# Patient Record
Sex: Female | Born: 1965 | Race: White | Hispanic: No | Marital: Single | Smoking: Never smoker
Health system: Southern US, Community
[De-identification: ages and names within clinical notes are randomized; demographics above are authoritative.]

---

## 2001-06-20 ENCOUNTER — Other Ambulatory Visit: Admission: RE | Admit: 2001-06-20 | Discharge: 2001-06-20 | Payer: Self-pay | Admitting: *Deleted

## 2001-06-20 ENCOUNTER — Other Ambulatory Visit: Admission: RE | Admit: 2001-06-20 | Discharge: 2001-06-20 | Payer: Self-pay | Admitting: Obstetrics & Gynecology

## 2001-07-03 ENCOUNTER — Encounter: Payer: Self-pay | Admitting: *Deleted

## 2001-07-03 ENCOUNTER — Inpatient Hospital Stay (HOSPITAL_COMMUNITY): Admission: AD | Admit: 2001-07-03 | Discharge: 2001-07-03 | Payer: Self-pay | Admitting: Obstetrics & Gynecology

## 2002-12-30 ENCOUNTER — Ambulatory Visit (HOSPITAL_COMMUNITY): Admission: RE | Admit: 2002-12-30 | Discharge: 2002-12-30 | Payer: Self-pay | Admitting: Internal Medicine

## 2003-04-23 ENCOUNTER — Ambulatory Visit (HOSPITAL_COMMUNITY): Admission: RE | Admit: 2003-04-23 | Discharge: 2003-04-23 | Payer: Self-pay | Admitting: Thoracic Surgery

## 2003-05-09 ENCOUNTER — Ambulatory Visit (HOSPITAL_COMMUNITY): Admission: RE | Admit: 2003-05-09 | Discharge: 2003-05-09 | Payer: Self-pay | Admitting: Thoracic Surgery

## 2006-08-03 ENCOUNTER — Ambulatory Visit (HOSPITAL_COMMUNITY): Admission: RE | Admit: 2006-08-03 | Discharge: 2006-08-03 | Payer: Self-pay | Admitting: Internal Medicine

## 2016-05-18 ENCOUNTER — Other Ambulatory Visit: Payer: Self-pay | Admitting: Obstetrics & Gynecology

## 2016-05-18 DIAGNOSIS — R928 Other abnormal and inconclusive findings on diagnostic imaging of breast: Secondary | ICD-10-CM

## 2016-06-14 ENCOUNTER — Ambulatory Visit
Admission: RE | Admit: 2016-06-14 | Discharge: 2016-06-14 | Disposition: A | Discharge status: Home / Self Care | Payer: BLUE CROSS/BLUE SHIELD | Source: Ambulatory Visit | Attending: Obstetrics & Gynecology | Admitting: Obstetrics & Gynecology

## 2016-06-14 DIAGNOSIS — R928 Other abnormal and inconclusive findings on diagnostic imaging of breast: Secondary | ICD-10-CM

## 2017-01-09 ENCOUNTER — Encounter (HOSPITAL_COMMUNITY): Payer: Self-pay | Admitting: Emergency Medicine

## 2017-01-09 ENCOUNTER — Ambulatory Visit (HOSPITAL_COMMUNITY)
Admission: EM | Admit: 2017-01-09 | Discharge: 2017-01-09 | Disposition: A | Discharge status: Home / Self Care | Payer: BLUE CROSS/BLUE SHIELD | Attending: Family Medicine | Admitting: Family Medicine

## 2017-01-09 DIAGNOSIS — R42 Dizziness and giddiness: Secondary | ICD-10-CM

## 2017-01-09 MED ORDER — MECLIZINE HCL 25 MG PO TABS: ORAL_TABLET | ORAL | 0 refills | Status: AC

## 2017-01-09 NOTE — ED Triage Notes (Signed)
Pt here for intermittent dizziness onset 4 days associated w/nausea  Denies HAs, fevers, cold sx  A&O x4... NAD.... ambulatory

## 2017-01-09 NOTE — ED Provider Notes (Signed)
[facility]    CSN: [id number] Arrival date & time: 01/09/17  1013     History   Chief Complaint Chief Complaint  Patient presents with  . Dizziness    HPI Katherine Norris is a 51 y.o. female.   51 year old female complaining of dizziness for 2 days. Occasionally associated with transient mild nausea. She also has some right ear discomfort, PND and cough. She states that lying on one side or the other sometimes exacerbate symptoms as discussed leaning over or turning her head rapidly.      History reviewed. No pertinent past medical history.  There are no active problems to display for this patient.   History reviewed. No pertinent surgical history.  OB History    No data available       Home Medications    Prior to Admission medications   Medication Sig Start Date End Date Taking? Authorizing Provider  meclizine (ANTIVERT) 25 MG tablet Take 1/2 to 1 tab BID or q hs prn dizziness. 01/09/17   Hayden Rasmussen, NP    Family History History reviewed. No pertinent family history.  Social History Social History  Substance Use Topics  . Smoking status: Never Smoker  . Smokeless tobacco: Never Used  . Alcohol use No     Allergies   Patient has no known allergies.   Review of Systems Review of Systems  Constitutional: Negative.   HENT: Negative for congestion, sinus pain, sore throat and trouble swallowing.   Respiratory: Negative.   Cardiovascular: Negative.   Musculoskeletal: Negative.   Neurological: Positive for dizziness. Negative for tremors, seizures, syncope, facial asymmetry, speech difficulty, weakness, numbness and headaches.  Psychiatric/Behavioral: Negative.   All other systems reviewed and are negative.    Physical Exam Triage Vital Signs ED Triage Vitals [01/09/17 1102]  Enc Vitals Group     BP 119/61     Pulse Rate 61     Resp 16     Temp 98.1 F (36.7 C)     Temp Source Oral     SpO2 100 %     Weight      Height    Head Circumference      Peak Flow      Pain Score      Pain Loc      Pain Edu?      Excl. in GC?    No data found.   Updated Vital Signs BP 119/61 (BP Location: Left Arm)   Pulse 61   Temp 98.1 F (36.7 C) (Oral)   Resp 16   LMP  (Approximate)   SpO2 100%   Visual Acuity Right Eye Distance:   Left Eye Distance:   Bilateral Distance:    Right Eye Near:   Left Eye Near:    Bilateral Near:     Physical Exam  Constitutional: She is oriented to person, place, and time. She appears well-developed and well-nourished. No distress.  HENT:  Head: Normocephalic and atraumatic.  Right Ear: External ear normal.  Mouth/Throat: No oropharyngeal exudate.  Left TM little dull otherwise normal. Right TM moderately retracted. No effusion or erythema.  Oropharynx with moderate erythema and injection. Mild-to-moderate amount of clear PND.  Eyes: EOM are normal.  Neck: Normal range of motion. Neck supple.  Cardiovascular: Normal rate, regular rhythm, normal heart sounds and intact distal pulses.   Pulmonary/Chest: Effort normal and breath sounds normal. No respiratory distress. She has no wheezes.  Musculoskeletal: Normal range of motion. She  exhibits no deformity.  Neurological: She is alert and oriented to person, place, and time.  Skin: Skin is warm and dry.  Psychiatric: She has a normal mood and affect.  Nursing note and vitals reviewed.    UC Treatments / Results  Labs (all labs ordered are listed, but only abnormal results are displayed) Labs Reviewed - No data to display  EKG  EKG Interpretation None       Radiology No results found.  Procedures Procedures (including critical care time)  Medications Ordered in UC Medications - No data to display   Initial Impression / Assessment and Plan / UC Course  I have reviewed the triage vital signs and the nursing notes.  Pertinent labs & imaging results that were available during my care of the patient were reviewed  by me and considered in my medical decision making (see chart for details).    Drink plenty of fluids and stay well-hydrated. Recommend taking Allegra or Zyrtec or Claritin to help with the drainage. He may take the Antivert at nighttime to help with dizziness. Drink plenty fluids and stay well-hydrated. If getting worse or not getting better in the next few days follow-up with her primary care doctor.    Final Clinical Impressions(s) / UC Diagnoses   Final diagnoses:  Dizziness    New Prescriptions New Prescriptions   MECLIZINE (ANTIVERT) 25 MG TABLET    Take 1/2 to 1 tab BID or q hs prn dizziness.     Controlled Substance Prescriptions [place] Controlled Substance Registry consulted? Not Applicable   Hayden Rasmussen, NP 01/09/17 1140

## 2017-01-09 NOTE — Discharge Instructions (Signed)
Drink plenty of fluids and stay well-hydrated. Recommend taking Allegra or Zyrtec or Claritin to help with the drainage. He may take the Antivert at nighttime to help with dizziness. Drink plenty fluids and stay well-hydrated. If getting worse or not getting better in the next few days follow-up with her primary care doctor.

## 2017-02-27 ENCOUNTER — Other Ambulatory Visit: Payer: Self-pay | Admitting: Obstetrics & Gynecology

## 2017-02-27 DIAGNOSIS — N6320 Unspecified lump in the left breast, unspecified quadrant: Secondary | ICD-10-CM

## 2017-03-02 ENCOUNTER — Other Ambulatory Visit: Payer: Self-pay | Admitting: Obstetrics & Gynecology

## 2017-03-02 ENCOUNTER — Ambulatory Visit
Admission: RE | Admit: 2017-03-02 | Discharge: 2017-03-02 | Disposition: A | Discharge status: Home / Self Care | Payer: BLUE CROSS/BLUE SHIELD | Source: Ambulatory Visit | Attending: Obstetrics & Gynecology | Admitting: Obstetrics & Gynecology

## 2017-03-02 DIAGNOSIS — N6320 Unspecified lump in the left breast, unspecified quadrant: Secondary | ICD-10-CM

## 2017-06-01 ENCOUNTER — Ambulatory Visit
Admission: RE | Admit: 2017-06-01 | Discharge: 2017-06-01 | Disposition: A | Discharge status: Home / Self Care | Payer: BLUE CROSS/BLUE SHIELD | Source: Ambulatory Visit | Attending: Obstetrics & Gynecology | Admitting: Obstetrics & Gynecology

## 2017-06-01 DIAGNOSIS — N6320 Unspecified lump in the left breast, unspecified quadrant: Secondary | ICD-10-CM

## 2019-12-31 ENCOUNTER — Other Ambulatory Visit: Payer: Self-pay | Admitting: Physician Assistant

## 2019-12-31 DIAGNOSIS — N63 Unspecified lump in unspecified breast: Secondary | ICD-10-CM

## 2020-01-14 ENCOUNTER — Other Ambulatory Visit: Payer: Self-pay | Admitting: Internal Medicine

## 2020-01-15 ENCOUNTER — Other Ambulatory Visit: Payer: Self-pay | Admitting: Obstetrics & Gynecology

## 2020-01-15 ENCOUNTER — Other Ambulatory Visit: Payer: Self-pay | Admitting: Physician Assistant

## 2020-01-15 DIAGNOSIS — N63 Unspecified lump in unspecified breast: Secondary | ICD-10-CM

## 2020-01-16 ENCOUNTER — Ambulatory Visit
Admission: RE | Admit: 2020-01-16 | Discharge: 2020-01-16 | Disposition: A | Discharge status: Home / Self Care | Payer: BLUE CROSS/BLUE SHIELD | Source: Ambulatory Visit | Attending: Physician Assistant | Admitting: Physician Assistant

## 2020-01-16 ENCOUNTER — Other Ambulatory Visit: Payer: Self-pay

## 2020-01-16 ENCOUNTER — Other Ambulatory Visit: Payer: Self-pay | Admitting: Obstetrics & Gynecology

## 2020-01-16 ENCOUNTER — Ambulatory Visit
Admission: RE | Admit: 2020-01-16 | Discharge: 2020-01-16 | Disposition: A | Discharge status: Home / Self Care | Payer: BC Managed Care – PPO | Source: Ambulatory Visit | Attending: Physician Assistant | Admitting: Physician Assistant

## 2020-01-16 DIAGNOSIS — N63 Unspecified lump in unspecified breast: Secondary | ICD-10-CM

## 2020-01-17 ENCOUNTER — Other Ambulatory Visit: Payer: Self-pay | Admitting: Physician Assistant

## 2020-06-24 ENCOUNTER — Emergency Department (HOSPITAL_BASED_OUTPATIENT_CLINIC_OR_DEPARTMENT_OTHER)
Admission: EM | Admit: 2020-06-24 | Discharge: 2020-06-24 | Disposition: A | Discharge status: Home / Self Care | Payer: BC Managed Care – PPO | Attending: Emergency Medicine | Admitting: Emergency Medicine

## 2020-06-24 ENCOUNTER — Emergency Department (HOSPITAL_BASED_OUTPATIENT_CLINIC_OR_DEPARTMENT_OTHER): Payer: BC Managed Care – PPO

## 2020-06-24 ENCOUNTER — Encounter (HOSPITAL_BASED_OUTPATIENT_CLINIC_OR_DEPARTMENT_OTHER): Payer: Self-pay | Admitting: Emergency Medicine

## 2020-06-24 DIAGNOSIS — R079 Chest pain, unspecified: Secondary | ICD-10-CM | POA: Diagnosis not present

## 2020-06-24 DIAGNOSIS — R0789 Other chest pain: Secondary | ICD-10-CM

## 2020-06-24 LAB — BASIC METABOLIC PANEL
Anion gap: 10 (ref 5–15)
BUN: 12 mg/dL (ref 6–20)
CO2: 29 mmol/L (ref 22–32)
Calcium: 9.5 mg/dL (ref 8.9–10.3)
Chloride: 100 mmol/L (ref 98–111)
Creatinine, Ser: 0.9 mg/dL (ref 0.44–1.00)
GFR, Estimated: >60 mL/min (ref >60)
Glucose, Bld: 119 mg/dL — ABNORMAL HIGH (ref 70–99)
Potassium: 3.8 mmol/L (ref 3.5–5.1)
Sodium: 139 mmol/L (ref 135–145)

## 2020-06-24 LAB — CBC WITH DIFFERENTIAL/PLATELET
Abs Immature Granulocytes: 0.02 10*3/uL (ref 0.00–0.07)
Basophils Absolute: 0 10*3/uL (ref 0.0–0.1)
Basophils Relative: 1 %
Eosinophils Absolute: 0.1 10*3/uL (ref 0.0–0.5)
Eosinophils Relative: 2 %
HCT: 38.4 % (ref 36.0–46.0)
Hemoglobin: 12.9 g/dL (ref 12.0–15.0)
Immature Granulocytes: 0 %
Lymphocytes Relative: 35 %
Lymphs Abs: 2.2 10*3/uL (ref 0.7–4.0)
MCH: 28.5 pg (ref 26.0–34.0)
MCHC: 33.6 g/dL (ref 30.0–36.0)
MCV: 84.8 fL (ref 80.0–100.0)
Monocytes Absolute: 0.6 10*3/uL (ref 0.1–1.0)
Monocytes Relative: 9 %
Neutro Abs: 3.3 10*3/uL (ref 1.7–7.7)
Neutrophils Relative %: 53 %
Platelets: 278 10*3/uL (ref 150–400)
RBC: 4.53 MIL/uL (ref 3.87–5.11)
RDW: 12.9 % (ref 11.5–15.5)
WBC: 6.2 10*3/uL (ref 4.0–10.5)
nRBC: 0 % (ref 0.0–0.2)

## 2020-06-24 LAB — TROPONIN I (HIGH SENSITIVITY)
Troponin I (High Sensitivity): 3 ng/L (ref <18)
Troponin I (High Sensitivity): 3 ng/L (ref <18)

## 2020-06-24 NOTE — Discharge Instructions (Addendum)
Call your primary care doctor or specialist as discussed in the next 2-3 days.   Return immediately back to the ER if:  Your symptoms worsen within the next 12-24 hours. You develop new symptoms such as new fevers, persistent vomiting, new pain, shortness of breath, or new weakness or numbness, or if you have any other concerns.  

## 2020-06-24 NOTE — ED Provider Notes (Signed)
[facility] EMERGENCY DEPARTMENT Provider Note   CSN: [id number] Arrival date & time: 06/24/20  1507     History Chief Complaint  Patient presents with  . Chest Pain    Katherine Norris is a 55 y.o. female.  Patient presents with concern for mid chest pain.  Describes as a pressure-like sensation that is intermittent throughout the day.  Symptoms have been off and on for the past 2 weeks.  States that she had an episode of exertion a few days ago where she walked around her building and went up and down several flights of stairs breaking the small sweat, but had no chest pain or chest discomfort with exertion.  Currently denies any pain or discomfort she called her primary care doctor who advised her to come to the ER for further evaluation.  No fevers no cough no vomiting no diarrhea no shortness of breath.        History reviewed. No pertinent past medical history.  There are no problems to display for this patient.   History reviewed. No pertinent surgical history.   OB History   No obstetric history on file.     Family History  Problem Relation Age of Onset  . Breast cancer Maternal Aunt   . Breast cancer Maternal Grandmother     Social History   Tobacco Use  . Smoking status: Never Smoker  . Smokeless tobacco: Never Used  Substance Use Topics  . Alcohol use: No  . Drug use: Yes    Types: Marijuana    Home Medications Prior to Admission medications   Medication Sig Start Date End Date Taking? Authorizing Provider  meclizine (ANTIVERT) 25 MG tablet Take 1/2 to 1 tab BID or q hs prn dizziness. 01/09/17   Hayden Rasmussen, NP    Allergies    Patient has no known allergies.  Review of Systems   Review of Systems  Constitutional: Negative for fever.  HENT: Negative for ear pain.   Eyes: Negative for pain.  Respiratory: Negative for cough.   Cardiovascular: Positive for chest pain.  Gastrointestinal: Negative for abdominal pain.  Genitourinary:  Negative for flank pain.  Musculoskeletal: Negative for back pain.  Skin: Negative for rash.  Neurological: Negative for headaches.    Physical Exam Updated Vital Signs BP 116/68   Pulse 61   Temp 98 F (36.7 C) (Oral)   Resp (!) 21   Ht 5' 5.5" (1.664 m)   Wt 101.3 kg   SpO2 99%   BMI 36.61 kg/m   Physical Exam Constitutional:      General: She is not in acute distress.    Appearance: Normal appearance.  HENT:     Head: Normocephalic.     Nose: Nose normal.  Eyes:     Extraocular Movements: Extraocular movements intact.  Cardiovascular:     Rate and Rhythm: Normal rate.  Pulmonary:     Effort: Pulmonary effort is normal.  Musculoskeletal:        General: Normal range of motion.     Cervical back: Normal range of motion.  Neurological:     General: No focal deficit present.     Mental Status: She is alert. Mental status is at baseline.     ED Results / Procedures / Treatments   Labs (all labs ordered are listed, but only abnormal results are displayed) Labs Reviewed  BASIC METABOLIC PANEL - Abnormal; Notable for the following components:      Result Value  Glucose, Bld 119 (*)    All other components within normal limits  CBC WITH DIFFERENTIAL/PLATELET  TROPONIN I (HIGH SENSITIVITY)  TROPONIN I (HIGH SENSITIVITY)    EKG EKG Interpretation  Date/Time:  Wednesday June 24 2020 15:21:22 EDT Ventricular Rate:  69 PR Interval:    QRS Duration: 93 QT Interval:  407 QTC Calculation: 436 R Axis:   79 Text Interpretation: Sinus rhythm Confirmed by Norman Clay ([id number]) on 06/24/2020 3:47:43 PM   Radiology DG Chest Port 1 View  Result Date: 06/24/2020 CLINICAL DATA:  Central chest pain EXAM: PORTABLE CHEST 1 VIEW COMPARISON:  08/03/2006 FINDINGS: The heart size and mediastinal contours are within normal limits. Both lungs are clear. The visualized skeletal structures are unremarkable. IMPRESSION: No active disease. Electronically Signed   By: Alcide Clever  M.D.   On: 06/24/2020 16:07    Procedures Procedures   Medications Ordered in ED Medications - No data to display  ED Course  I have reviewed the triage vital signs and the nursing notes.  Pertinent labs & imaging results that were available during my care of the patient were reviewed by me and considered in my medical decision making (see chart for details).    MDM Rules/Calculators/A&P                          EKGs normal-appearing sinus rhythm no ST elevations, no ST depressions, normal rate.  2 sets of troponin sent and both are unremarkable.  Will advise outpatient follow-up with cardiology within the week.  Advised immediate return for worsening symptoms trouble breathing or any additional concerns.   Final Clinical Impression(s) / ED Diagnoses Final diagnoses:  Chest pain    Rx / DC Orders ED Discharge Orders    None       Cheryll Cockayne, MD 06/24/20 [phone]

## 2020-06-24 NOTE — ED Triage Notes (Signed)
Reports central chest pain describes as sore for the last few weeks.  Reports she recently gained back the 60 pounds she had lost so her diet has been very poor.

## 2022-01-17 IMAGING — MG DIGITAL DIAGNOSTIC BILAT W/ TOMO W/ CAD
8 series · 8 of 24 positions shown · non-contrast
Comparison: Previous exam(s).

CLINICAL DATA: 54-year-old female presenting for follow-up of a
probably benign left breast mass first evaluated in June 2016.

EXAM:
DIGITAL DIAGNOSTIC BILATERAL MAMMOGRAM WITH TOMO
ULTRASOUND LEFT BREAST

[R MLO synth-2D]
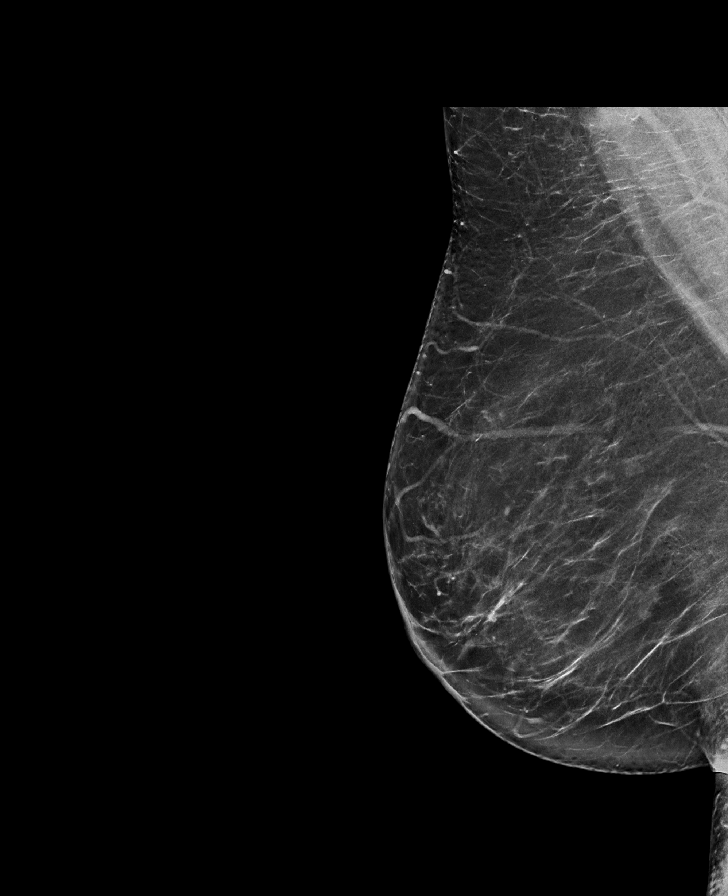

[L MLO synth-2D]
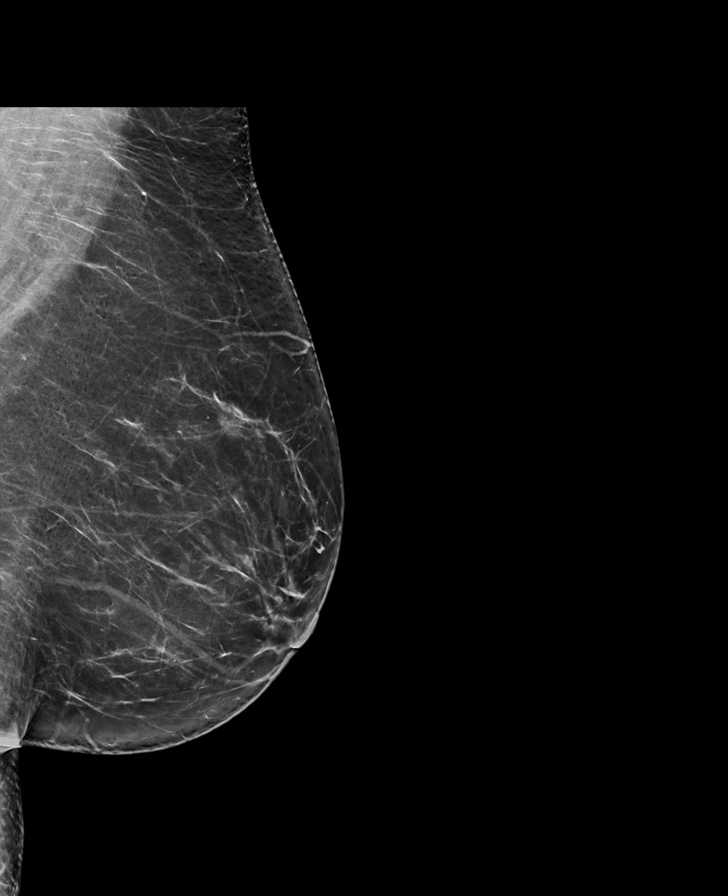

[R CC synth-2D]
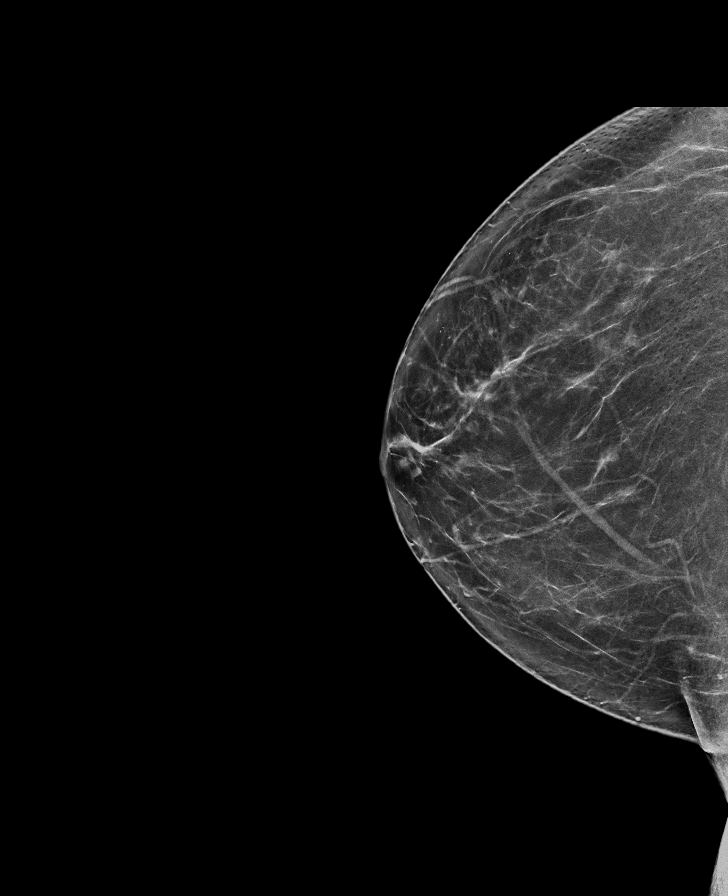

[L CC synth-2D]
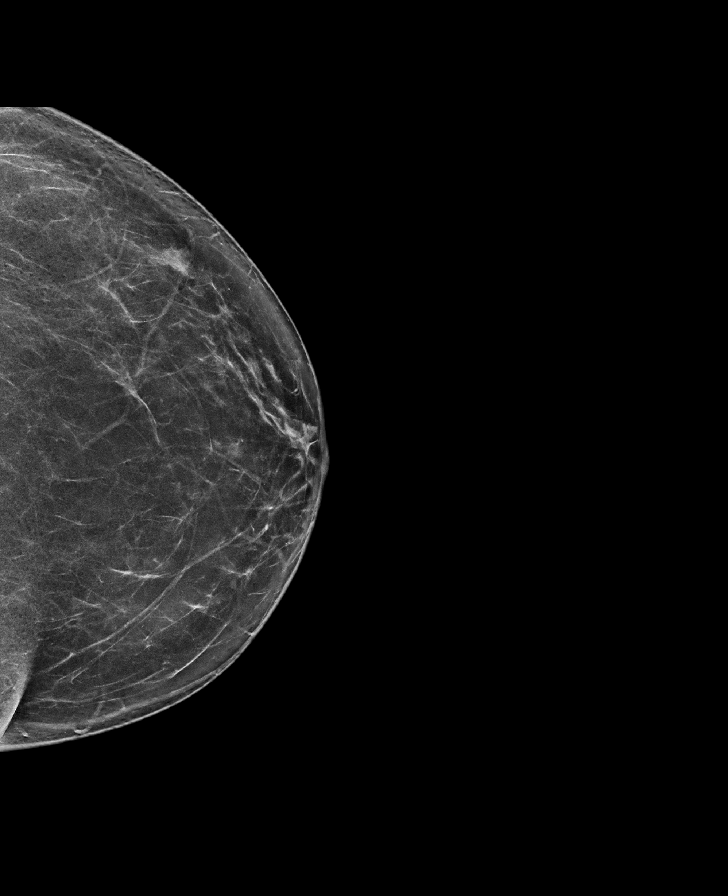

[L MLO tomo · tomo slice 41/81.0]
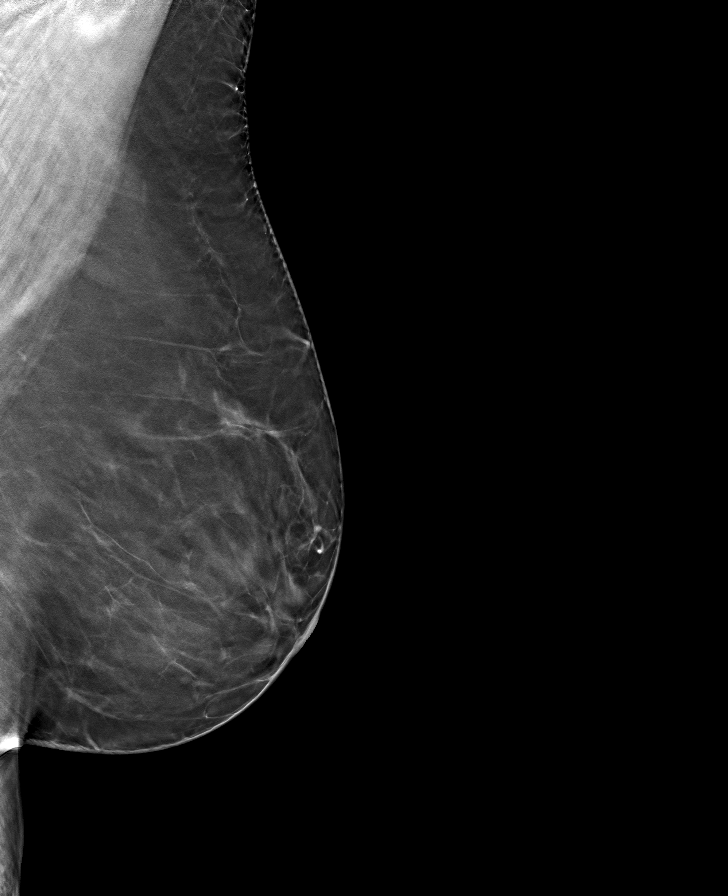

[R CC tomo · tomo slice 39/77.0]
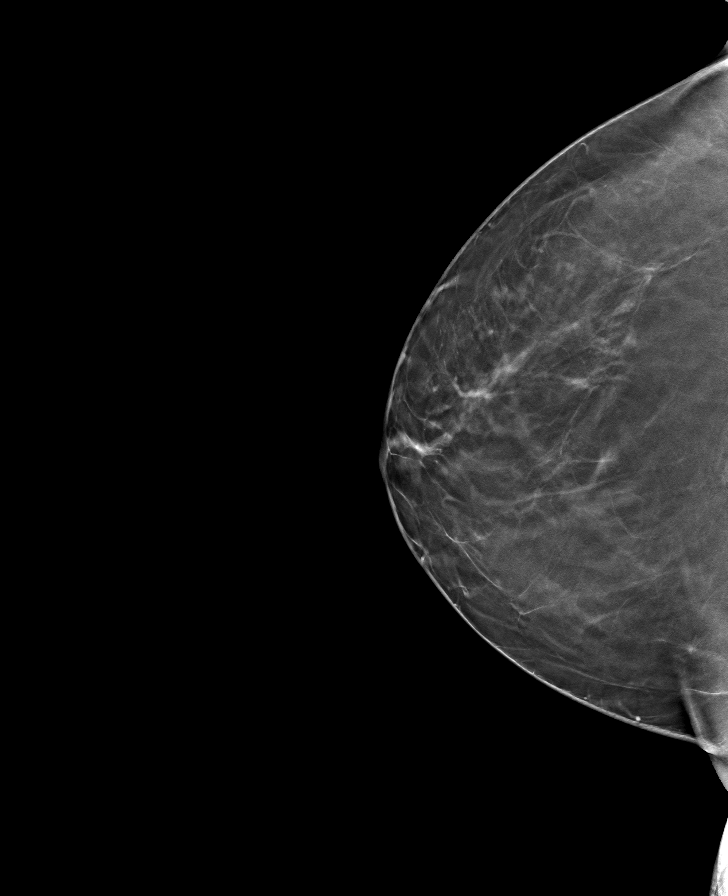

[R MLO tomo · tomo slice 42/83.0]
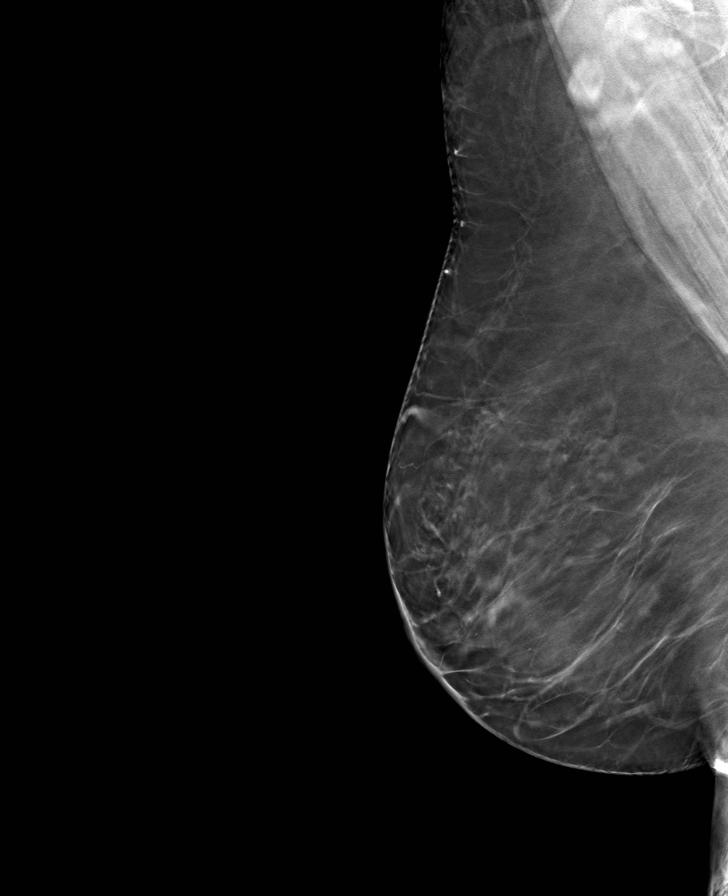

[L CC tomo · tomo slice 38/75.0]
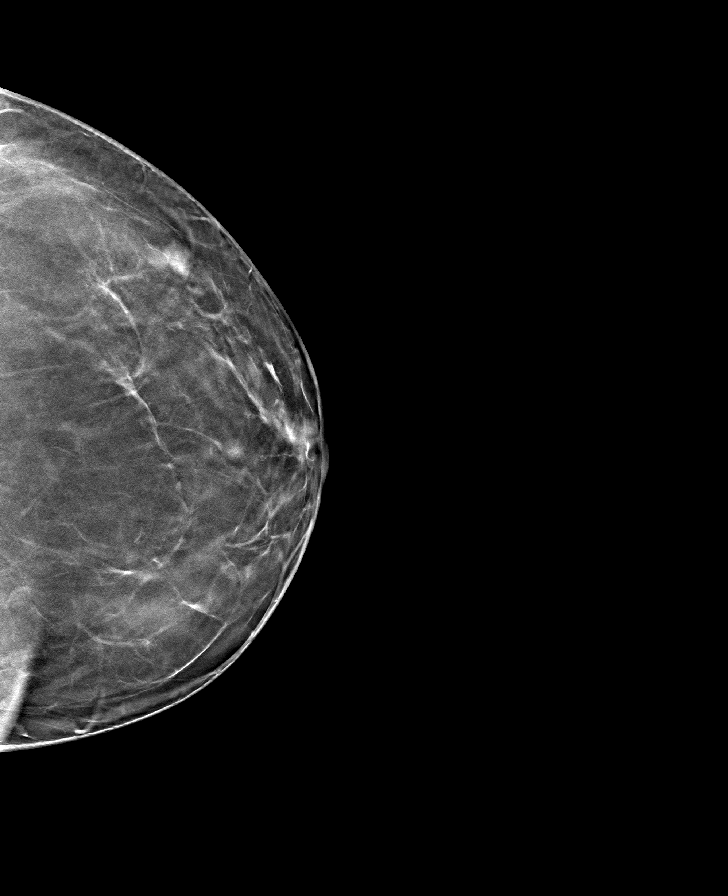

[8 of 24 positions shown; findings below may reference images not displayed]

ACR Breast Density Category b: There are scattered areas of
fibroglandular density.
FINDINGS: Mammogram:

Right breast: No suspicious mass, distortion, or microcalcifications
are identified to suggest presence of malignancy.

Left breast: Stable oval circumscribed mass in the upper outer
quadrant the left breast measuring approximately 0.8 cm. No new
suspicious mass, distortion, or microcalcifications are identified
to suggest presence of malignancy.

Ultrasound:

Targeted ultrasound is performed in the left breast at 2 o'clock 4
cm from nipple demonstrating an oval circumscribed hypoechoic mass
with probable central fatty hilum measuring 0.8 x 0.3 x 0.9 cm,
previously measuring 0.8 x 0.3 x 0.9 cm.
IMPRESSION: Stable mass in the left breast at 2 o'clock, likely a lymph node.
Given stability for over 2 years this is considered benign. No
mammographic evidence of malignancy in the bilateral breasts.

RECOMMENDATION:
Screening mammogram in one year.(Code:BT-6-N91)

I have discussed the findings and recommendations with the patient.
If applicable, a reminder letter will be sent to the patient
regarding the next appointment.

BI-RADS CATEGORY  2: Benign.

## 2022-01-17 IMAGING — US US BREAST*L* LIMITED INC AXILLA
1 series · 6 of 6 positions shown · non-contrast
Comparison: Previous exam(s).

CLINICAL DATA: 54-year-old female presenting for follow-up of a
probably benign left breast mass first evaluated in June 2016.

EXAM:
DIGITAL DIAGNOSTIC BILATERAL MAMMOGRAM WITH TOMO
ULTRASOUND LEFT BREAST

[Series 1: us breast*left* limited inc axilla · 0.06mm/px · 6 of 6 slices shown]
[im 1/6]
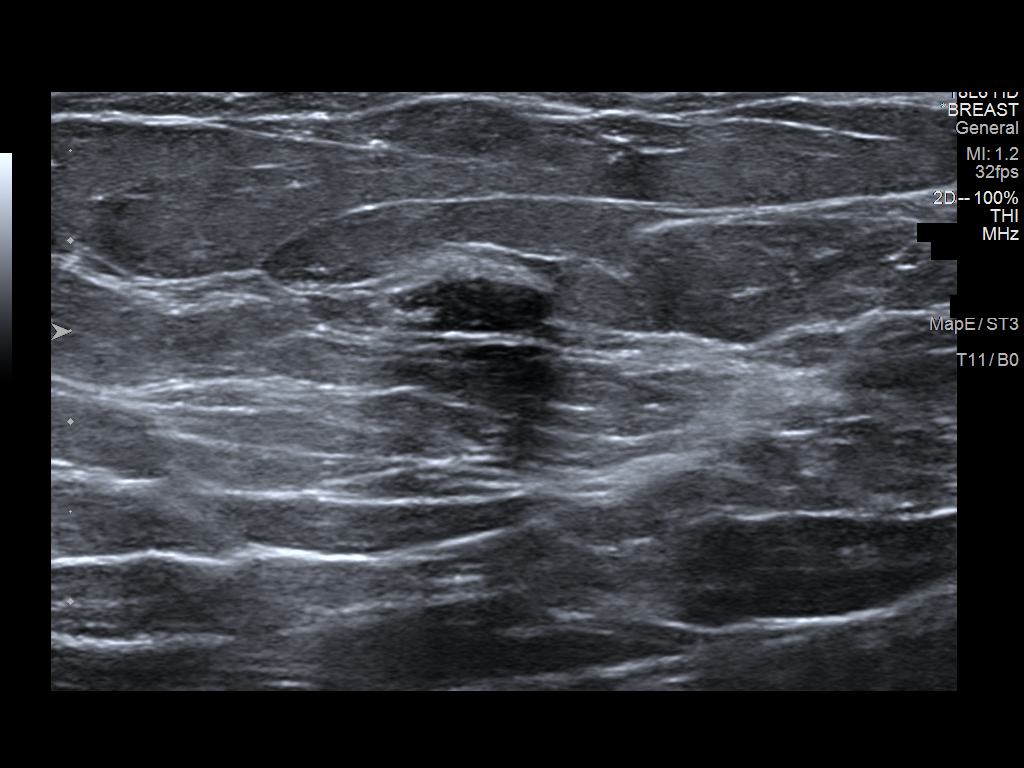
[im 2/6]
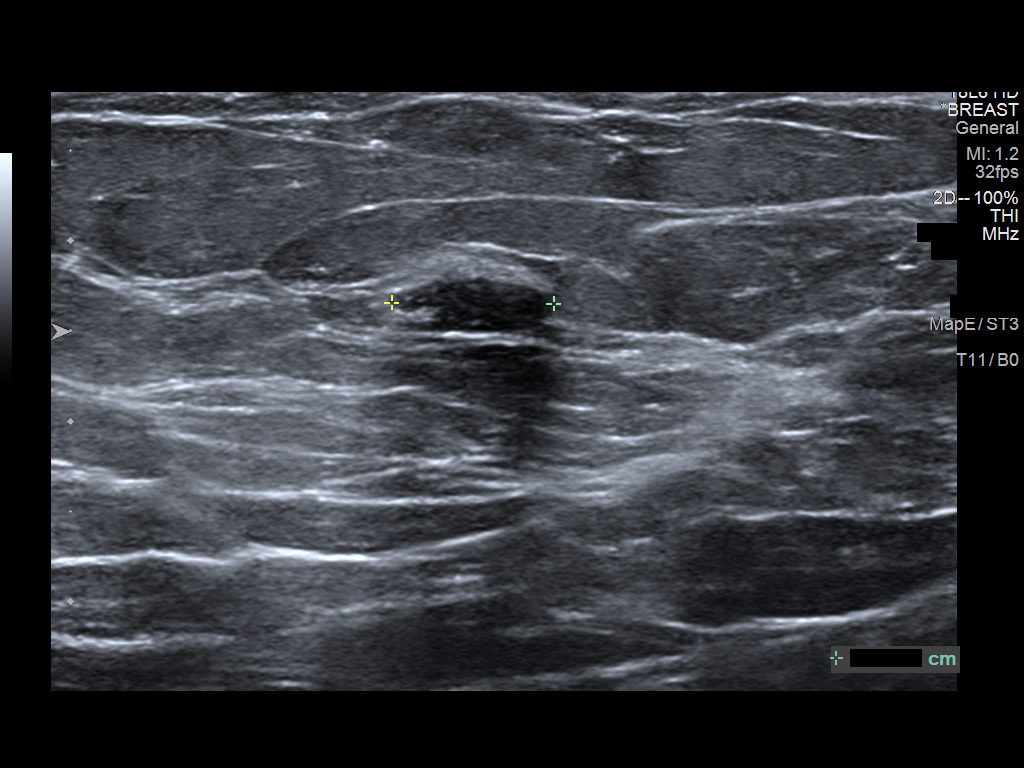
[im 3/6]
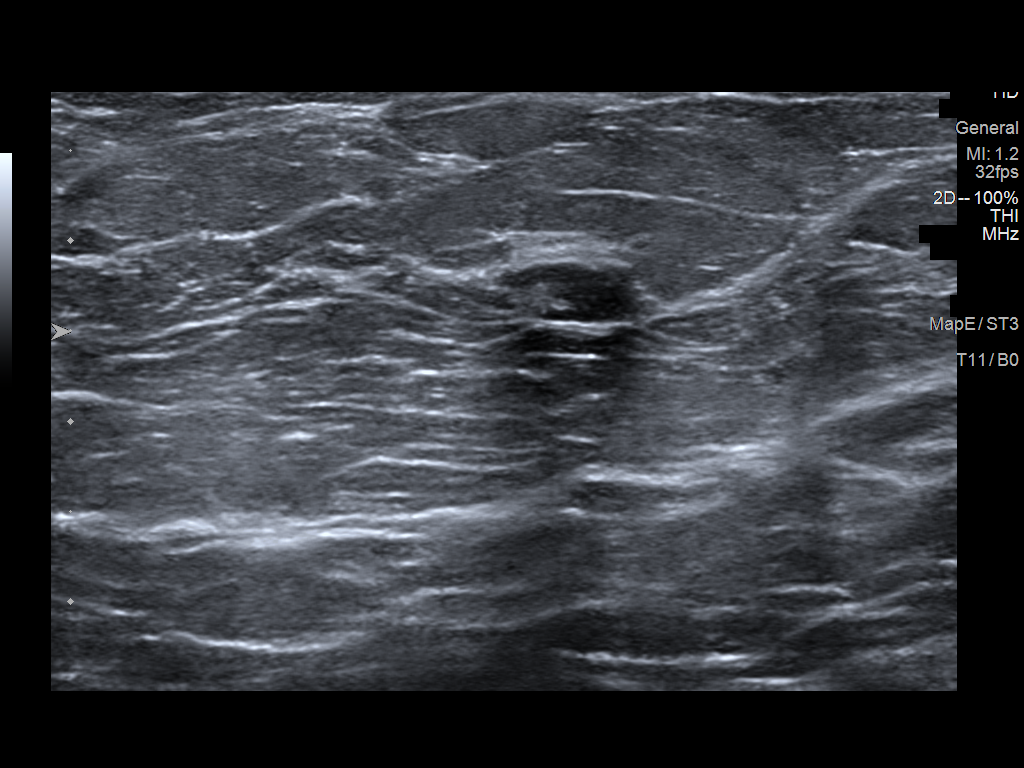
[im 4/6]
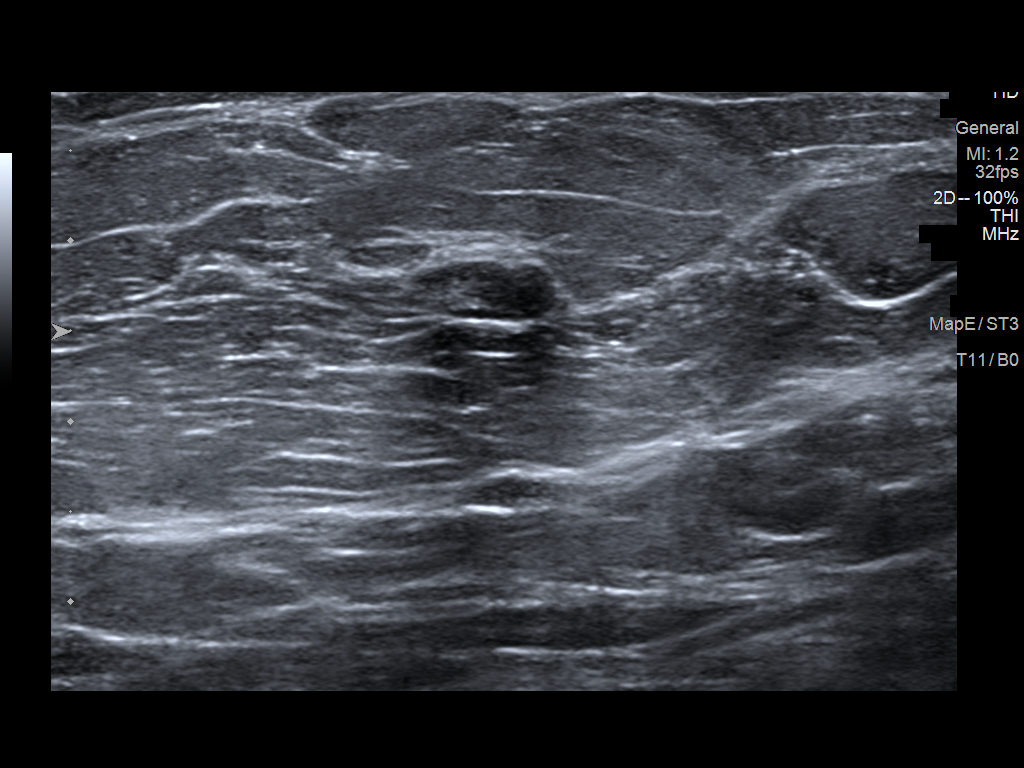
[im 5/6]
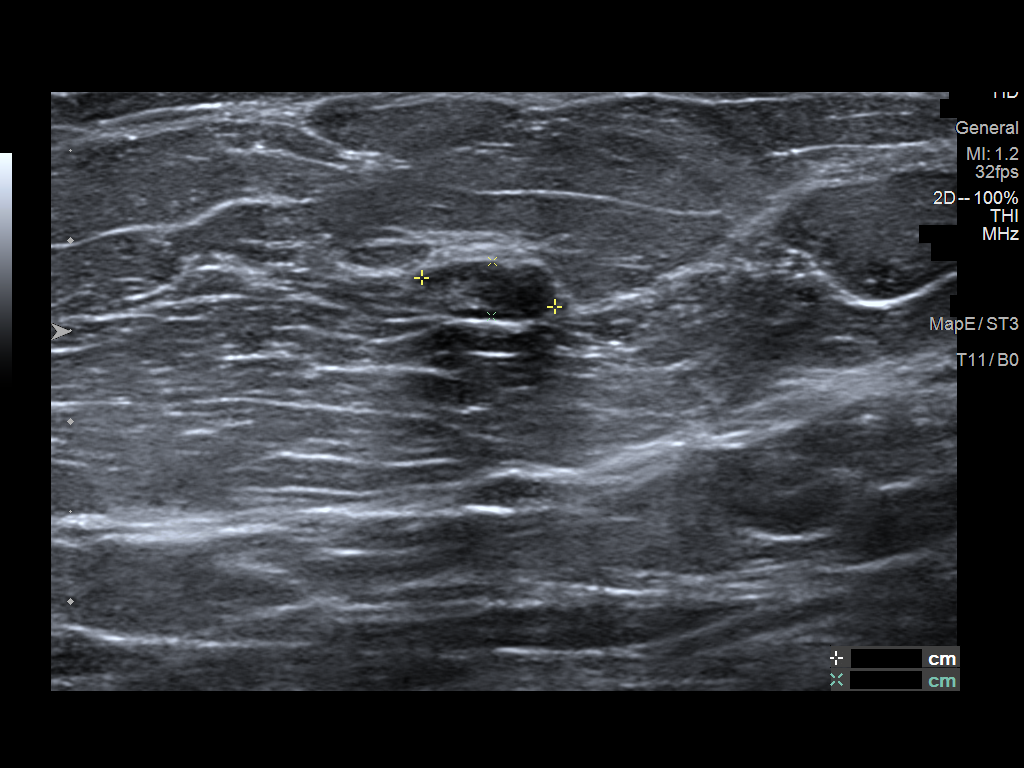
[im 6/6]
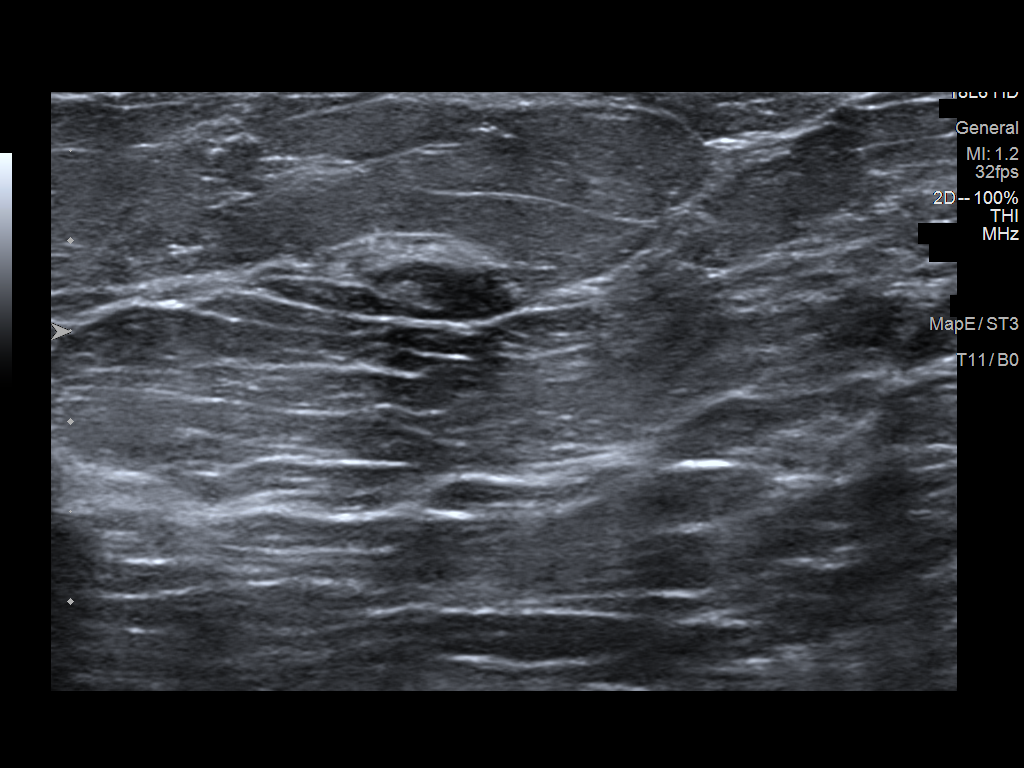

[6 of 6 positions shown; findings below may reference images not displayed]

ACR Breast Density Category b: There are scattered areas of
fibroglandular density.
FINDINGS: Mammogram:

Right breast: No suspicious mass, distortion, or microcalcifications
are identified to suggest presence of malignancy.

Left breast: Stable oval circumscribed mass in the upper outer
quadrant the left breast measuring approximately 0.8 cm. No new
suspicious mass, distortion, or microcalcifications are identified
to suggest presence of malignancy.

Ultrasound:

Targeted ultrasound is performed in the left breast at 2 o'clock 4
cm from nipple demonstrating an oval circumscribed hypoechoic mass
with probable central fatty hilum measuring 0.8 x 0.3 x 0.9 cm,
previously measuring 0.8 x 0.3 x 0.9 cm.
IMPRESSION: Stable mass in the left breast at 2 o'clock, likely a lymph node.
Given stability for over 2 years this is considered benign. No
mammographic evidence of malignancy in the bilateral breasts.

RECOMMENDATION:
Screening mammogram in one year.(Code:BT-6-N91)

I have discussed the findings and recommendations with the patient.
If applicable, a reminder letter will be sent to the patient
regarding the next appointment.

BI-RADS CATEGORY  2: Benign.

## 2022-06-26 IMAGING — DX DG CHEST 1V PORT
1 series · 1 of 1 positions shown · non-contrast
Comparison: 08/03/2006

CLINICAL DATA: Central chest pain

EXAM:
PORTABLE CHEST 1 VIEW

[chest ap]
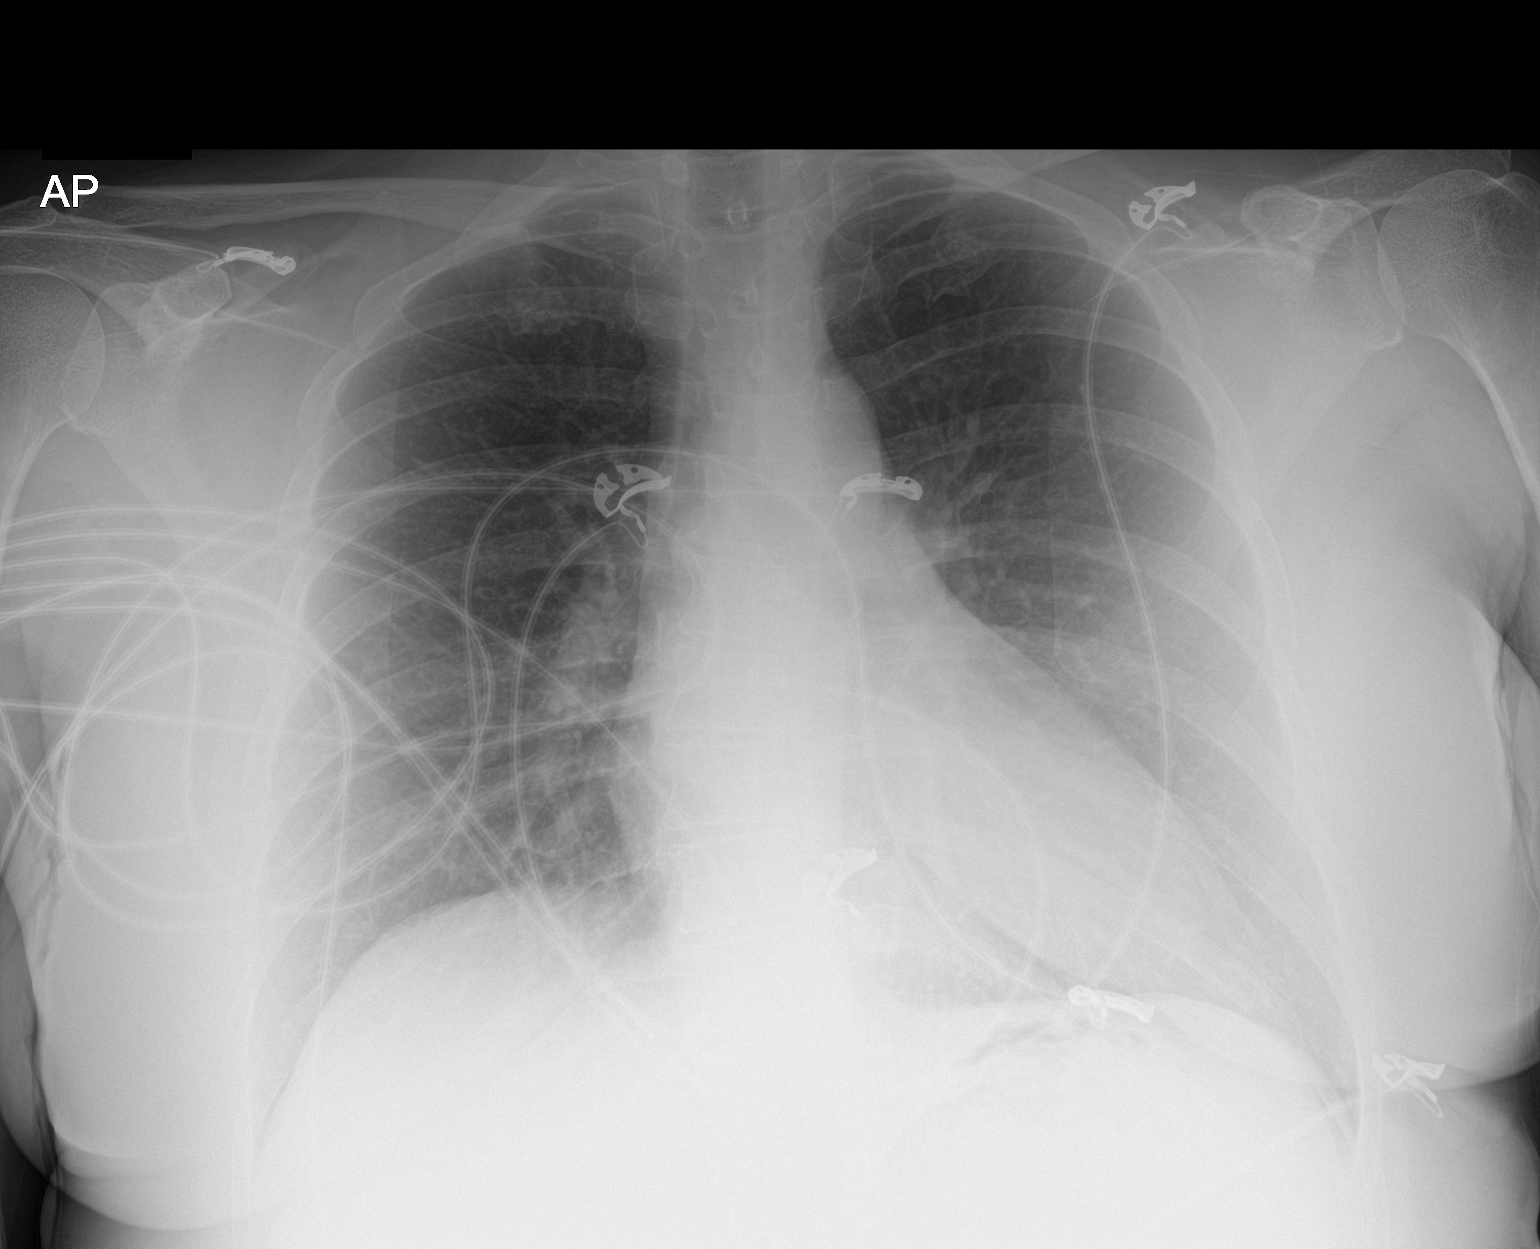

[1 of 1 positions shown; findings below may reference images not displayed]

FINDINGS: The heart size and mediastinal contours are within normal limits.
Both lungs are clear. The visualized skeletal structures are
unremarkable.
IMPRESSION: No active disease.
